# Patient Record
Sex: Female | Born: 1935 | Race: White | Hispanic: No | Smoking: Never smoker
Health system: Southern US, Community
[De-identification: ages and names within clinical notes are randomized; demographics above are authoritative.]

## PROBLEM LIST (undated history)

## (undated) DIAGNOSIS — I10 Essential (primary) hypertension: Secondary | ICD-10-CM

## (undated) DIAGNOSIS — E079 Disorder of thyroid, unspecified: Secondary | ICD-10-CM

---

## 2015-04-11 ENCOUNTER — Emergency Department (HOSPITAL_COMMUNITY)
Admission: EM | Admit: 2015-04-11 | Discharge: 2015-04-11 | Disposition: A | Discharge status: Home / Self Care | Payer: Medicare Other | Attending: Emergency Medicine | Admitting: Emergency Medicine

## 2015-04-11 ENCOUNTER — Emergency Department (HOSPITAL_COMMUNITY): Payer: Medicare Other

## 2015-04-11 ENCOUNTER — Encounter (HOSPITAL_COMMUNITY): Payer: Self-pay | Admitting: Emergency Medicine

## 2015-04-11 DIAGNOSIS — Z79899 Other long term (current) drug therapy: Secondary | ICD-10-CM | POA: Insufficient documentation

## 2015-04-11 DIAGNOSIS — I10 Essential (primary) hypertension: Secondary | ICD-10-CM | POA: Diagnosis not present

## 2015-04-11 DIAGNOSIS — S0003XA Contusion of scalp, initial encounter: Secondary | ICD-10-CM | POA: Diagnosis not present

## 2015-04-11 DIAGNOSIS — W108XXA Fall (on) (from) other stairs and steps, initial encounter: Secondary | ICD-10-CM | POA: Insufficient documentation

## 2015-04-11 DIAGNOSIS — Y998 Other external cause status: Secondary | ICD-10-CM | POA: Insufficient documentation

## 2015-04-11 DIAGNOSIS — S8011XA Contusion of right lower leg, initial encounter: Secondary | ICD-10-CM | POA: Diagnosis not present

## 2015-04-11 DIAGNOSIS — Y9301 Activity, walking, marching and hiking: Secondary | ICD-10-CM | POA: Diagnosis not present

## 2015-04-11 DIAGNOSIS — S0990XA Unspecified injury of head, initial encounter: Secondary | ICD-10-CM | POA: Diagnosis present

## 2015-04-11 DIAGNOSIS — Y9289 Other specified places as the place of occurrence of the external cause: Secondary | ICD-10-CM | POA: Diagnosis not present

## 2015-04-11 DIAGNOSIS — S300XXA Contusion of lower back and pelvis, initial encounter: Secondary | ICD-10-CM | POA: Insufficient documentation

## 2015-04-11 DIAGNOSIS — T148XXA Other injury of unspecified body region, initial encounter: Secondary | ICD-10-CM

## 2015-04-11 DIAGNOSIS — E079 Disorder of thyroid, unspecified: Secondary | ICD-10-CM | POA: Insufficient documentation

## 2015-04-11 HISTORY — DX: Essential (primary) hypertension: I10

## 2015-04-11 HISTORY — DX: Disorder of thyroid, unspecified: E07.9

## 2015-04-11 MED ORDER — IBUPROFEN 200 MG PO TABS
600.0000 mg | ORAL_TABLET | ORAL | Status: AC
  Administered 2015-04-11: 600 mg via ORAL
  Filled 2015-04-11: qty 3

## 2015-04-11 NOTE — ED Notes (Signed)
Pts daughter states she called ahead and was told it was "only a 30 min wait." I explained to Pts daughter that i was sorry she was told that and that wait times are always changing and that i was sorry she was having to wait. Also informed Pts daughter that RN was working with another Pt and that she was trying to make her way in the room as soon as possible.

## 2015-04-11 NOTE — ED Provider Notes (Signed)
CSN: [id number]     Arrival date & time 04/11/15  0725 History   First MD Initiated Contact with Patient 04/11/15 0757     Chief Complaint  Patient presents with  . Head Injury     (Consider location/radiation/quality/duration/timing/severity/associated sxs/prior Treatment) HPI Comments: 81 year old female with history of hypertension and thyroid disease who presents with fall. Just prior to arrival, the patient was walking down stairs and fell down the stairs. She did not lose consciousness. She struck the left side of her head and left hip. She has been able to ambulate but complains of left knee pain, right shin pain, and left head pain since the fall. She denies any neck or back pain. No chest wall pain, abdominal pain, or difficulty breathing. No visual changes. No anticoagulant use. She is up-to-date on tetanus vaccination.  Patient is a 81 y.o. female presenting with head injury. The history is provided by the patient.  Head Injury   Past Medical History  Diagnosis Date  . Hypertension   . Thyroid disease    History reviewed. No pertinent past surgical history. History reviewed. No pertinent family history. Social History  Substance Use Topics  . Smoking status: Never Smoker   . Smokeless tobacco: None  . Alcohol Use: Yes     Comment: occ   OB History    No data available     Review of Systems 10 Systems reviewed and are negative for acute change except as noted in the HPI.    Allergies  Eggs or egg-derived products; Lotemax; Pineapple; and Shrimp  Home Medications   Prior to Admission medications   Medication Sig Start Date End Date Taking? Authorizing Provider  hydrochlorothiazide (HYDRODIURIL) 25 MG tablet Take 25 mg by mouth daily.   Yes Historical Provider, MD  levothyroxine (SYNTHROID, LEVOTHROID) 75 MCG tablet Take 75 mcg by mouth daily before breakfast.   Yes Historical Provider, MD  potassium chloride (K-DUR,KLOR-CON) 10 MEQ tablet Take 10 mEq by mouth  every morning.    Yes Historical Provider, MD   BP 180/70 mmHg  Pulse 88  Temp(Src) 97.9 F (36.6 C) (Oral)  Resp 16  Ht 5' (1.524 m)  Wt 128 lb (58.06 kg)  BMI 25.00 kg/m2  SpO2 100% Physical Exam  Constitutional: She is oriented to person, place, and time. She appears well-developed and well-nourished. No distress.  HENT:  Head: Normocephalic and atraumatic.  Mouth/Throat: Oropharynx is clear and moist.  Hematoma w/ overlying abrasion L lateral scalp, Moist mucous membranes  Eyes: Conjunctivae and EOM are normal. Pupils are equal, round, and reactive to light.  Neck: Neck supple.  Cardiovascular: Normal rate, regular rhythm and normal heart sounds.   No murmur heard. Pulmonary/Chest: Effort normal and breath sounds normal. She exhibits no tenderness.  Abdominal: Soft. Bowel sounds are normal. She exhibits no distension. There is no tenderness.  Musculoskeletal: She exhibits no edema.  L hip tenderness over ecchymosis on iliac crest; normal ROM hips, knees; 2+ pedal pulses; no spinal tenderness or stepoff  Neurological: She is alert and oriented to person, place, and time. A cranial nerve deficit is present. She exhibits normal muscle tone.  Fluent speech  Skin: Skin is warm and dry.  Hematoma L lateral scalp, hematoma R shin, ecchymosis L iliac crest  Psychiatric: She has a normal mood and affect. Judgment normal.  Nursing note and vitals reviewed.   ED Course  Procedures (including critical care time) Labs Review Labs Reviewed - No data to display  Imaging  Review Dg Tibia/fibula Right  04/11/2015  CLINICAL DATA:  Larey Seat down 5-6 steps with left hip, knee and right lower leg pain. EXAM: RIGHT TIBIA AND FIBULA - 2 VIEW COMPARISON:  None. FINDINGS: Mild focal cortical irregularity/lucency over the the fibular head on the lateral film as cannot exclude a nondisplaced fracture. Remainder of the exam is within normal. IMPRESSION: Possible nondisplaced fracture of the fibular head.  Electronically Signed   By: Elberta Fortis M.D.   On: 04/11/2015 09:15   Ct Head Wo Contrast  04/11/2015  CLINICAL DATA:  Fall down stairs with left head injury. EXAM: CT HEAD WITHOUT CONTRAST CT CERVICAL SPINE WITHOUT CONTRAST TECHNIQUE: Multidetector CT imaging of the head and cervical spine was performed following the standard protocol without intravenous contrast. Multiplanar CT image reconstructions of the cervical spine were also generated. COMPARISON:  None. FINDINGS: CT HEAD FINDINGS Ventricles, cisterns and other CSF spaces are normal. There is no mass, mass effect, shift of midline structures or acute hemorrhage. There is no evidence of acute infarction. Subtle chronic ischemic microvascular disease. Moderate size left parietal scalp contusion. No evidence of fracture CT CERVICAL SPINE FINDINGS Vertebral body alignment and heights are within normal. There is mild spondylosis throughout the cervical spine. There is partial fusion of the C2 and 3 vertebral bodies as well as posterior elements. Minimal disc space narrowing at the C6-7 level. Prevertebral soft tissues are within normal. Atlantoaxial articulation is within normal. There is mild uncovertebral joint spurring and facet arthropathy. There is no acute fracture or subluxation. Bilateral neural foraminal narrowing is present at several levels of the lower cervical spine. Few small hypodensities over the thyroid with the largest measuring 9 mm over the left lobe. IMPRESSION: No acute intracranial findings. Moderate size left parietal scalp contusion. No fracture. Minimal chronic ischemic microvascular disease. No acute cervical spine injury. Mild spondylosis throughout the cervical spine with minimal disc disease at the C6-7 level. Neural foraminal narrowing at several levels of the lower cervical spine. Few sub cm hypodense thyroid nodules. Electronically Signed   By: Elberta Fortis M.D.   On: 04/11/2015 09:01   Ct Cervical Spine Wo  Contrast  04/11/2015  CLINICAL DATA:  Fall down stairs with left head injury. EXAM: CT HEAD WITHOUT CONTRAST CT CERVICAL SPINE WITHOUT CONTRAST TECHNIQUE: Multidetector CT imaging of the head and cervical spine was performed following the standard protocol without intravenous contrast. Multiplanar CT image reconstructions of the cervical spine were also generated. COMPARISON:  None. FINDINGS: CT HEAD FINDINGS Ventricles, cisterns and other CSF spaces are normal. There is no mass, mass effect, shift of midline structures or acute hemorrhage. There is no evidence of acute infarction. Subtle chronic ischemic microvascular disease. Moderate size left parietal scalp contusion. No evidence of fracture CT CERVICAL SPINE FINDINGS Vertebral body alignment and heights are within normal. There is mild spondylosis throughout the cervical spine. There is partial fusion of the C2 and 3 vertebral bodies as well as posterior elements. Minimal disc space narrowing at the C6-7 level. Prevertebral soft tissues are within normal. Atlantoaxial articulation is within normal. There is mild uncovertebral joint spurring and facet arthropathy. There is no acute fracture or subluxation. Bilateral neural foraminal narrowing is present at several levels of the lower cervical spine. Few small hypodensities over the thyroid with the largest measuring 9 mm over the left lobe. IMPRESSION: No acute intracranial findings. Moderate size left parietal scalp contusion. No fracture. Minimal chronic ischemic microvascular disease. No acute cervical spine injury. Mild spondylosis throughout  the cervical spine with minimal disc disease at the C6-7 level. Neural foraminal narrowing at several levels of the lower cervical spine. Few sub cm hypodense thyroid nodules. Electronically Signed   By: Elberta Fortis M.D.   On: 04/11/2015 09:01   Dg Knee Complete 4 Views Left  04/11/2015  CLINICAL DATA:  Left knee pain after fall EXAM: LEFT KNEE - COMPLETE 4+  VIEW COMPARISON:  None. FINDINGS: No fracture, joint effusion, malalignment or suspicious focal osseous lesion. Mild joint space narrowing with tiny marginal osteophyte in the medial compartment. Otherwise preserved left knee joint. IMPRESSION: 1. No fracture, joint effusion or malalignment in the left knee. 2. Mild osteoarthritis in the medial compartment of the left knee joint. Electronically Signed   By: Delbert Phenix M.D.   On: 04/11/2015 09:13   Dg Hip Unilat With Pelvis 2-3 Views Left  04/11/2015  CLINICAL DATA:  Larey Seat down steps and complains of left hip pain. EXAM: DG HIP (WITH OR WITHOUT PELVIS) 2-3V LEFT COMPARISON:  None. FINDINGS: The pelvic bony ring is intact. Symmetric appearance of the sacroiliac joints. Moderate amount of stool throughout the lower abdomen and pelvis. Small phleboliths in the pelvis. Mild osteoarthritic changes along the superior aspect of both hip joints. The left hip is located without a fracture. IMPRESSION: No acute bone abnormality to the pelvis or left hip. Electronically Signed   By: Richarda Overlie M.D.   On: 04/11/2015 09:13      EKG Interpretation None      MDM   Final diagnoses:  Scalp hematoma, initial encounter  Bruise   Patient presents with fall down the stairs during which she struck her head but did not lose consciousness. Patient well-appearing at presentation. Vital signs notable for hypertension at 209/74. Normal strength and sensation throughout, multiple ecchymoses but normal range of motion of joints. Obtained CT of head and C-spine as well as plain films of left hip, left knee, and right tib-fib.  CTs were negative for acute injury. Plain films negative with the exception of small cortical irregularity at the fibular head. On reexamination, the patient is able to bear weight on her right leg with no complaints of pain in that area, therefore I do not feel this represents acute fracture. Patient has normal strength and sensation of her right  foot. Discussed supportive care instructions and instructed to follow-up with PCP if she develops any pain in her proximal right lower leg. Reviewed return precautions regarding head injury. The patient and her daughter voiced understanding and the patient was discharged in satisfactory condition.   Laurence Spates, MD 04/11/15 [phone]

## 2015-04-11 NOTE — ED Notes (Signed)
Patient reports that she fell down the stairs and hit her head to her left Head and has a small hematoma to her left head. Patient also reports that she is having pain to her right shin, she also have pain to her left hip. No shortening or rotation noted. The patient reports that she also has some minor aches and pain.

## 2015-04-11 NOTE — Discharge Instructions (Signed)

## 2016-10-24 IMAGING — CR DG KNEE COMPLETE 4+V*L*
4 series · 4 of 4 positions shown · non-contrast
Comparison: None.

CLINICAL DATA: Left knee pain after fall

EXAM:
LEFT KNEE - COMPLETE 4+ VIEW

[t knee ap left]
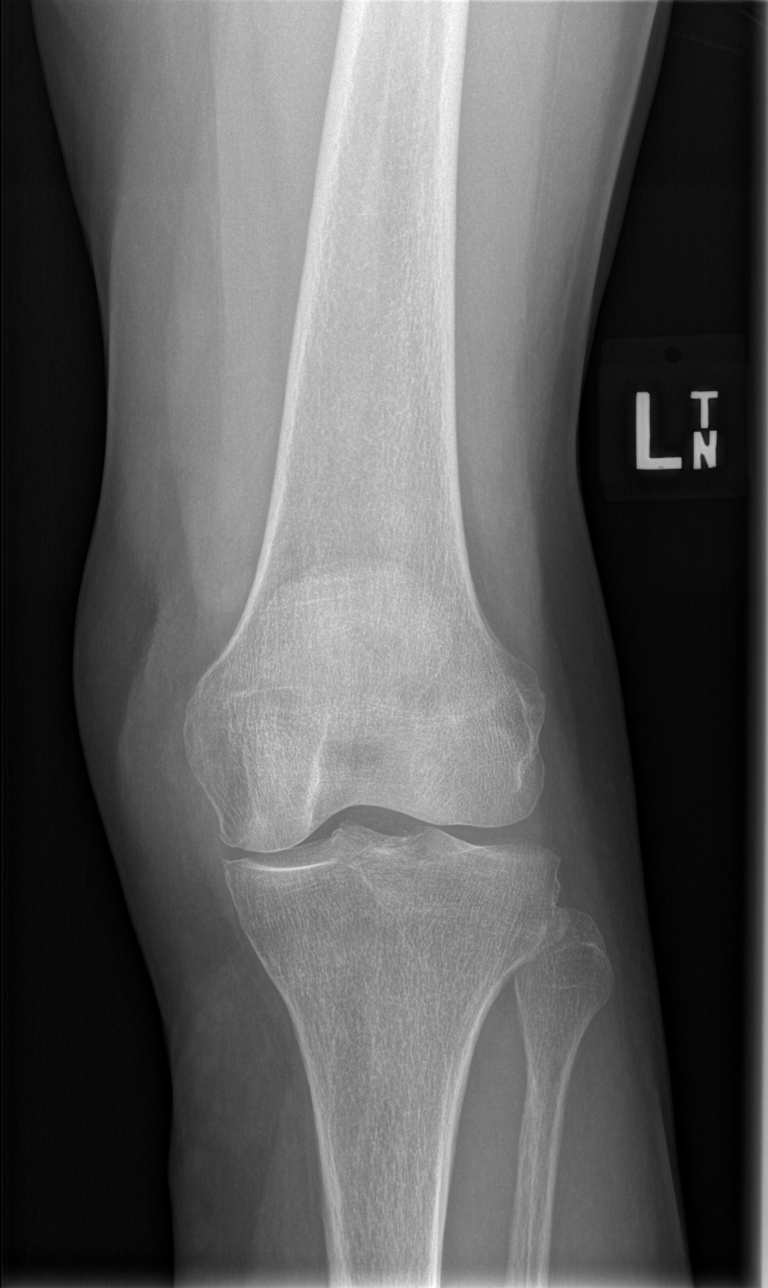

[t knee obl left (1 of 2)]
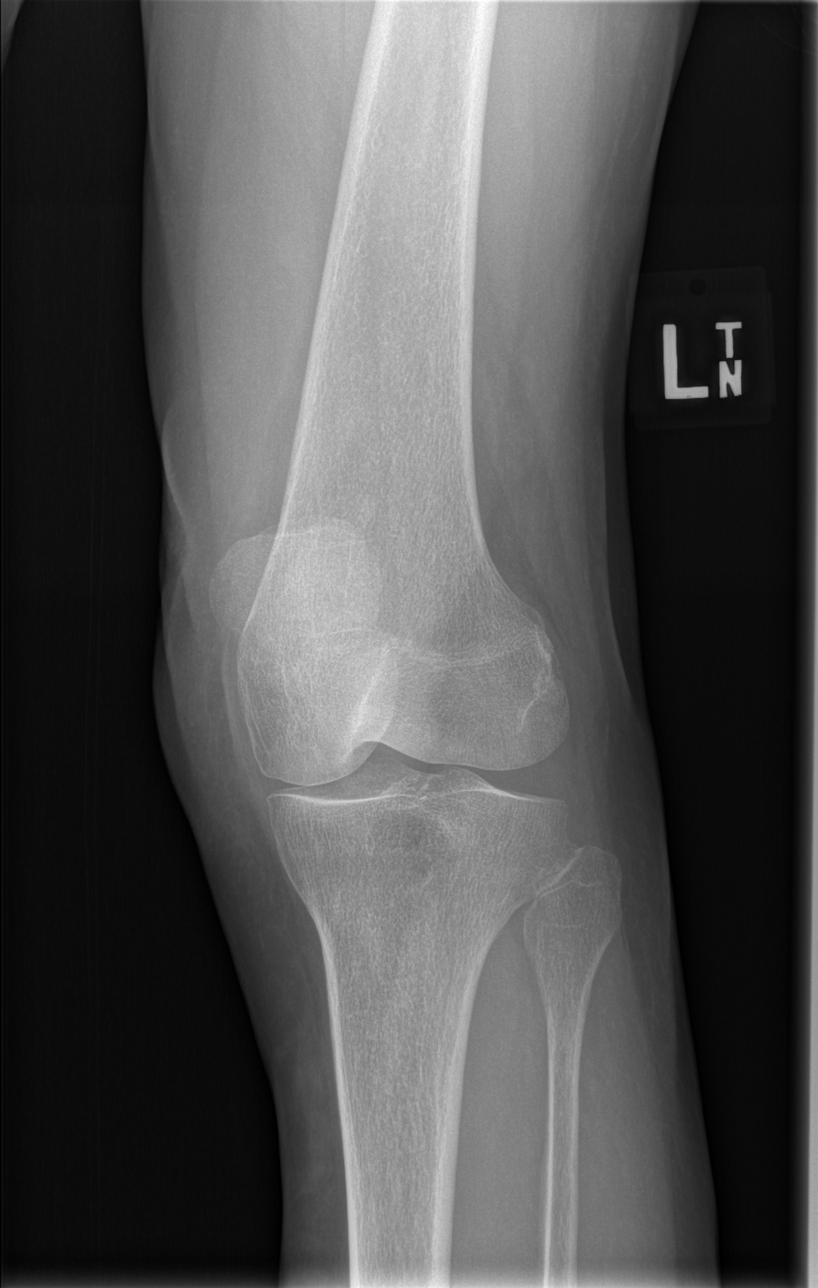

[t knee obl left (2 of 2)]
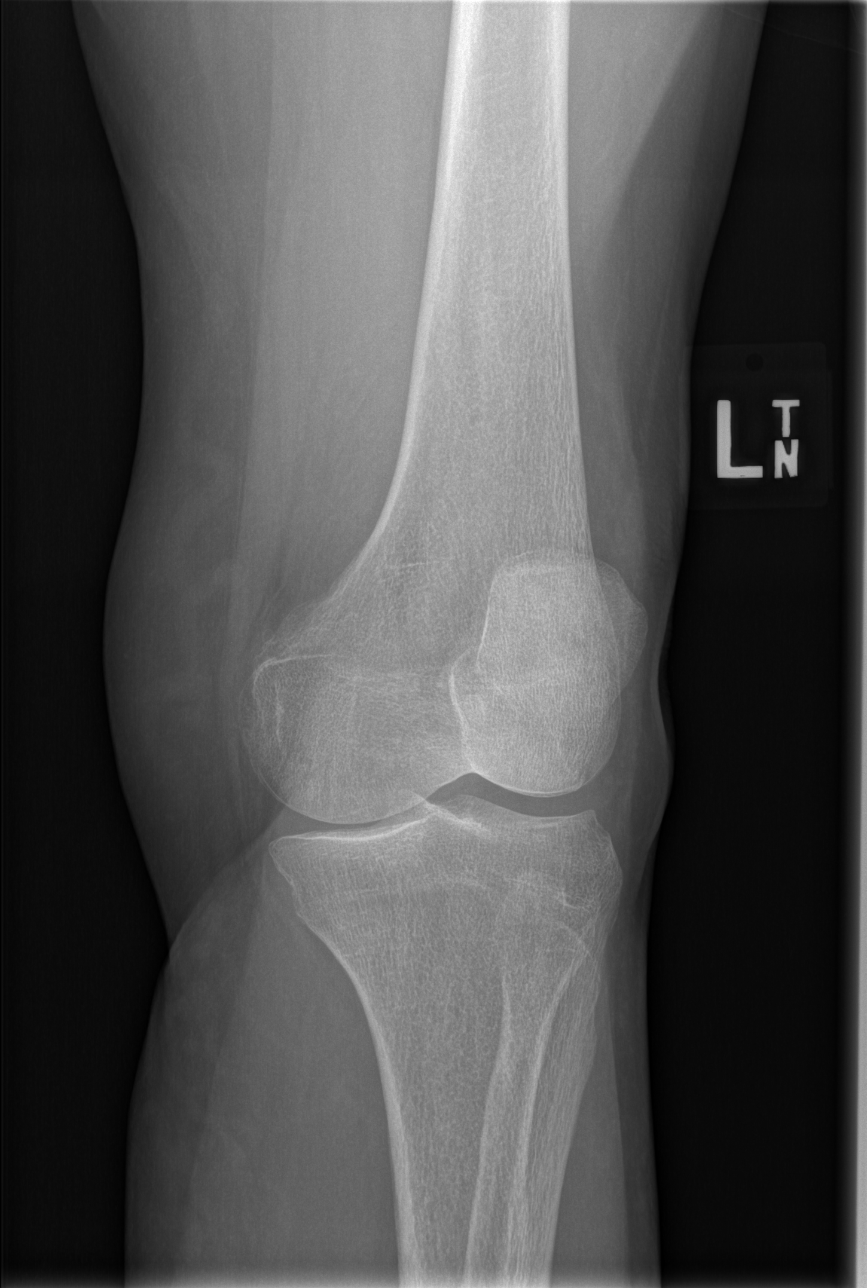

[t knee lat left]
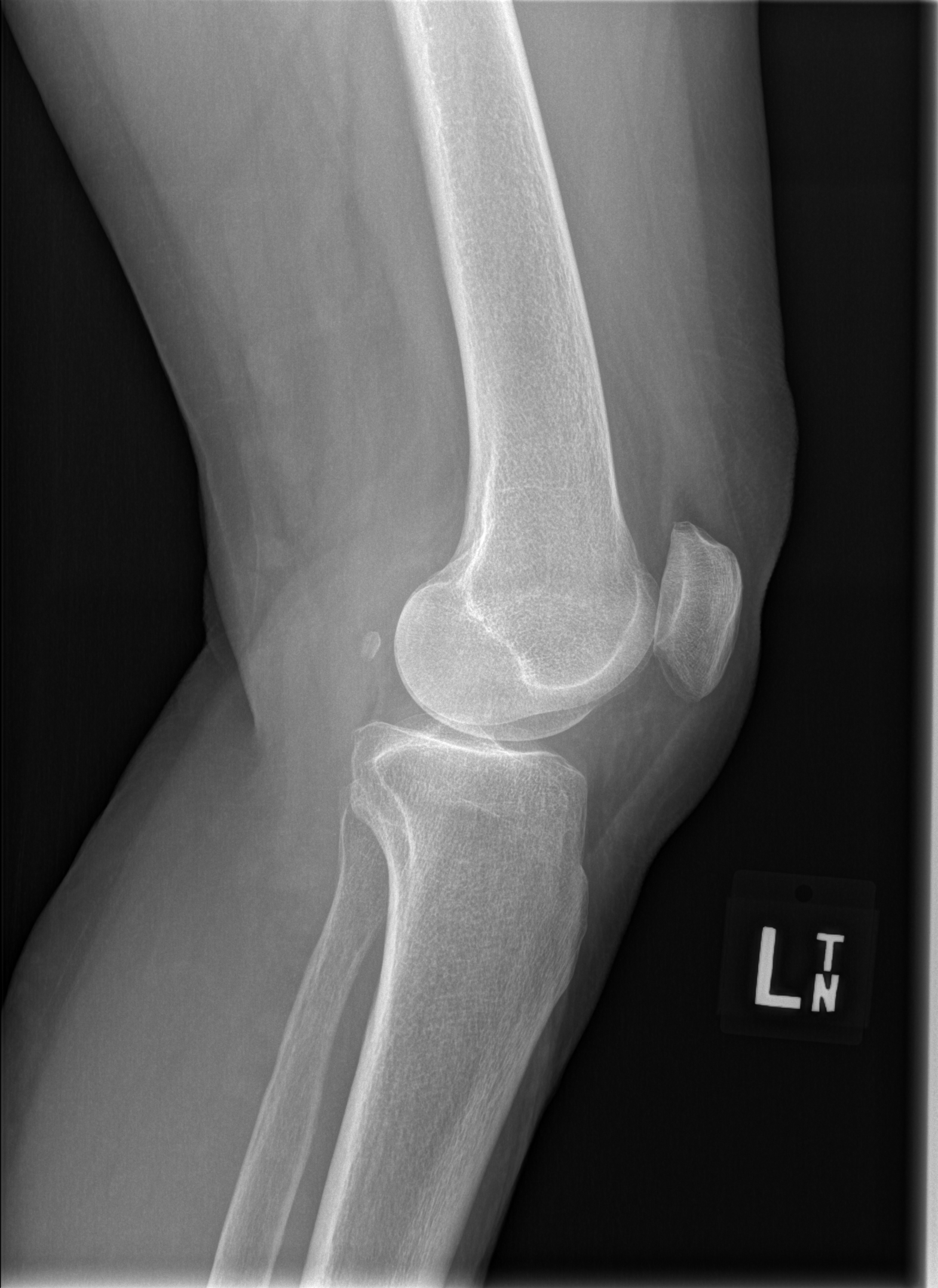

[4 of 4 positions shown; findings below may reference images not displayed]

FINDINGS: No fracture, joint effusion, malalignment or suspicious focal
osseous lesion. Mild joint space narrowing with tiny marginal
osteophyte in the medial compartment. Otherwise preserved left knee
joint.
IMPRESSION: 1. No fracture, joint effusion or malalignment in the left knee.
2. Mild osteoarthritis in the medial compartment of the left knee
joint.

## 2016-10-24 IMAGING — CT CT CERVICAL SPINE W/O CM
3 of 4 series · 13 of 27 positions shown, 16 images · non-contrast
Comparison: None.

CLINICAL DATA: Fall down stairs with left head injury.

EXAM:
CT HEAD WITHOUT CONTRAST
CT CERVICAL SPINE WITHOUT CONTRAST
TECHNIQUE: Multidetector CT imaging of the head and cervical spine was
performed following the standard protocol without intravenous
contrast. Multiplanar CT image reconstructions of the cervical spine
were also generated.

[Series 4: bone windows · axial · 0.43mm/px · z∈[-120,-48]mm · 3 of 49 slices shown]
[im 13/49  bone]
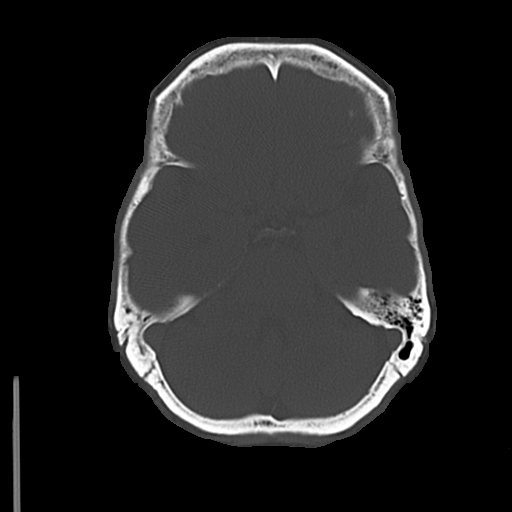
[im 25/49  bone]
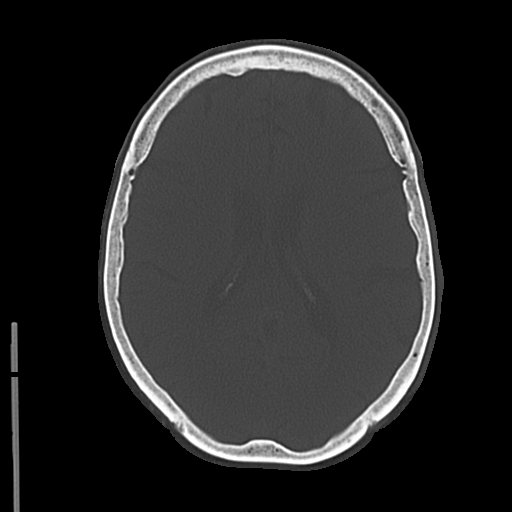
[im 37/49  bone]
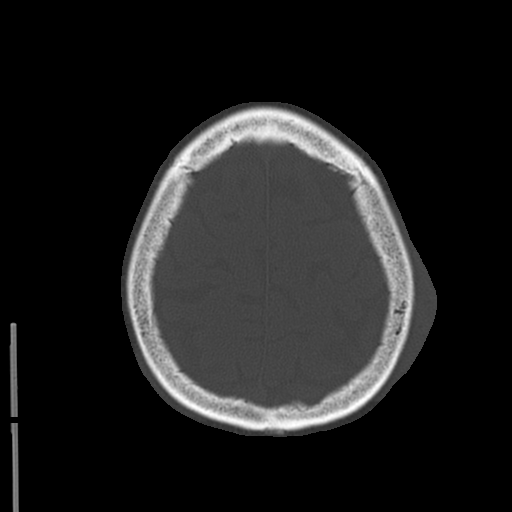

[Series 5: c-spine st · axial · 0.26mm/px · z∈[-276,-180]mm · 5 of 74 slices shown, 7 images]
[im 13/74  soft-tissue]
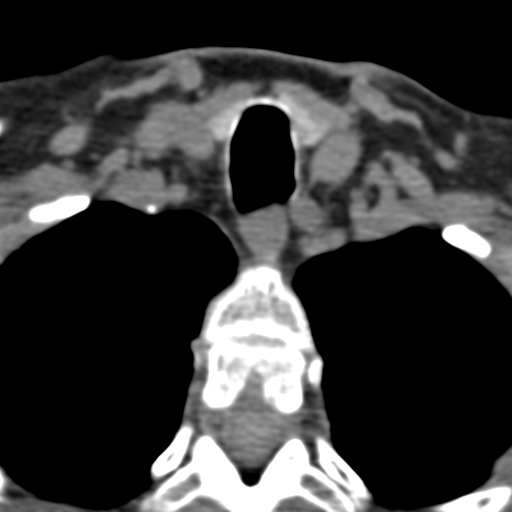
[im 13/74  bone]
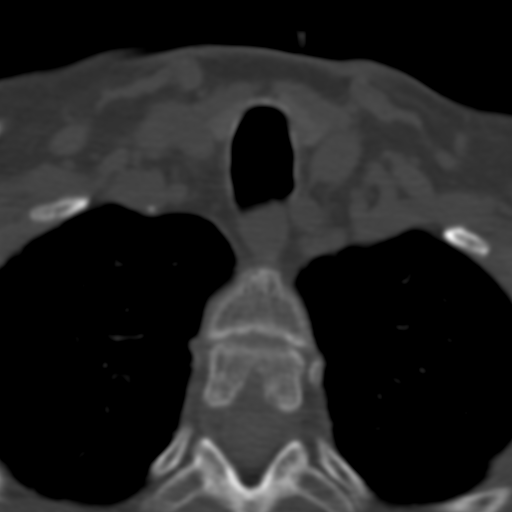
[im 25/74  bone]
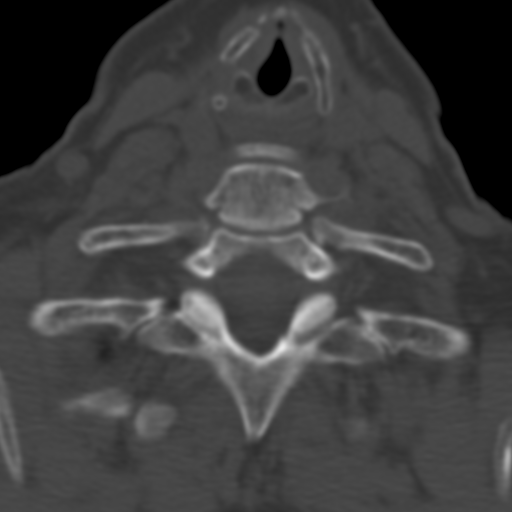
[im 37/74  bone]
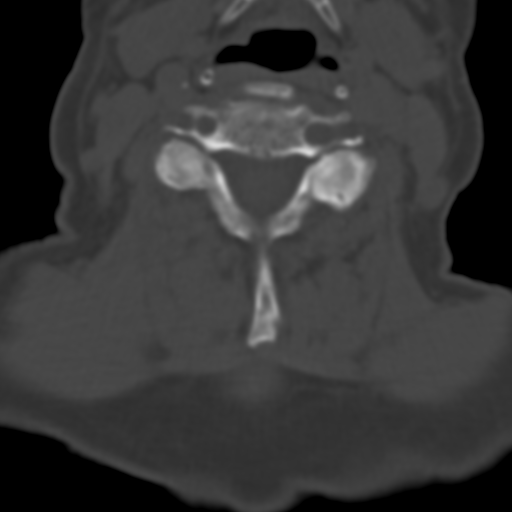
[im 49/74  bone]
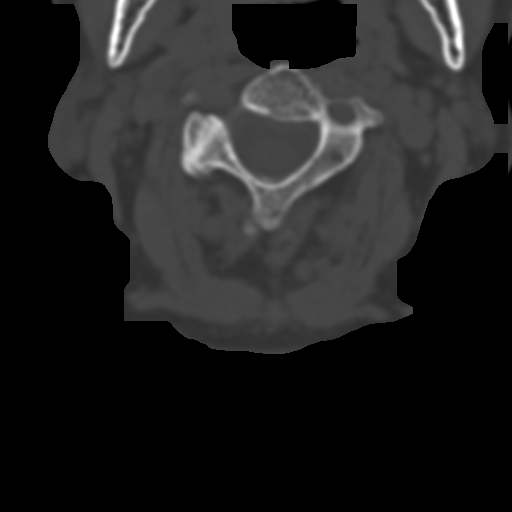
[im 61/74  soft-tissue]
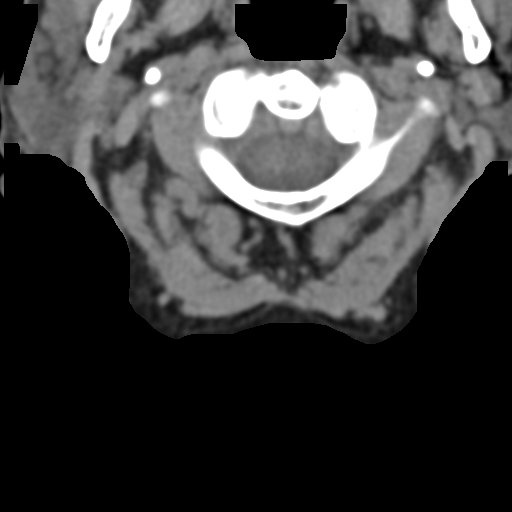
[im 61/74  bone]
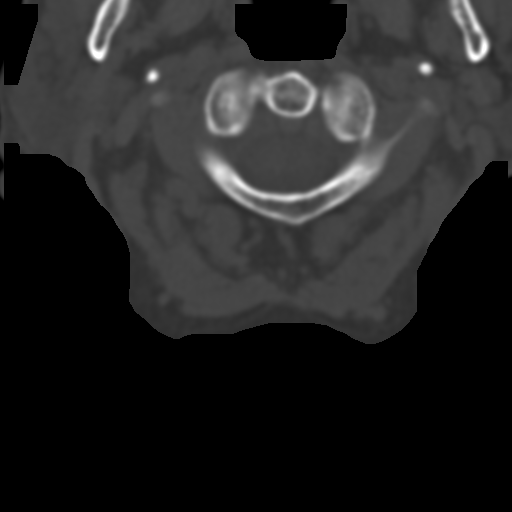

[Series 10: sagittal · sagittal · 0.28mm/px · 5 of 36 slices shown, 6 images]
[im 12/36  bone]
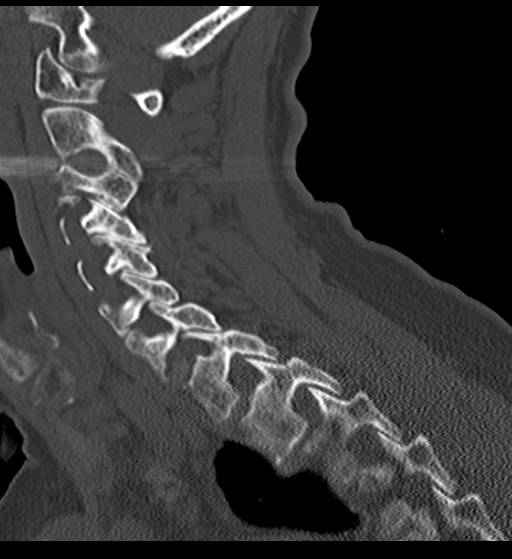
[im 15/36  bone]
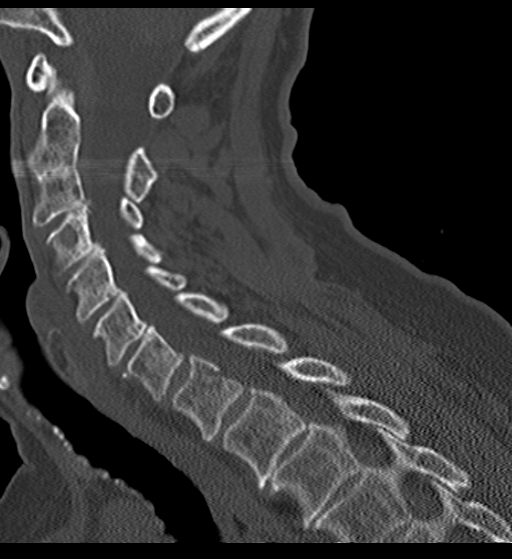
[im 18/36  soft-tissue]
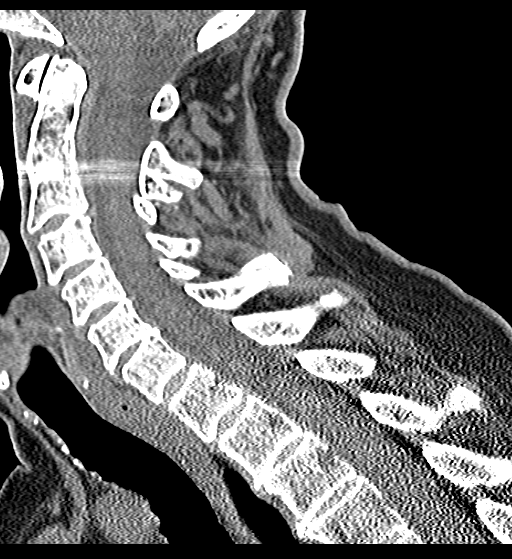
[im 18/36  bone]
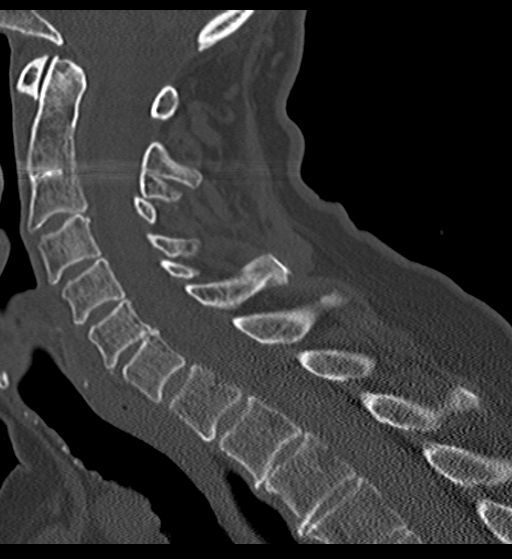
[im 21/36  bone]
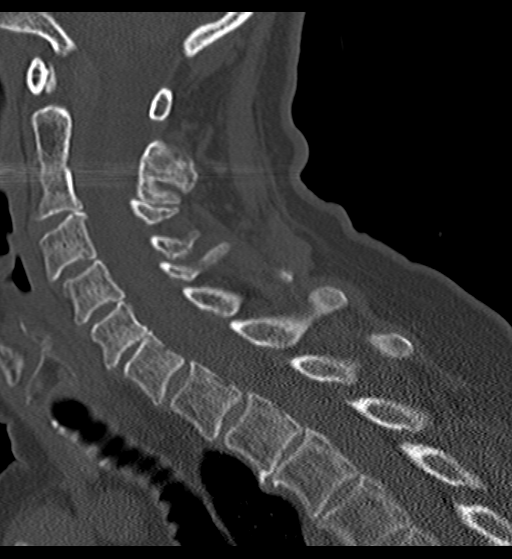
[im 24/36  bone]
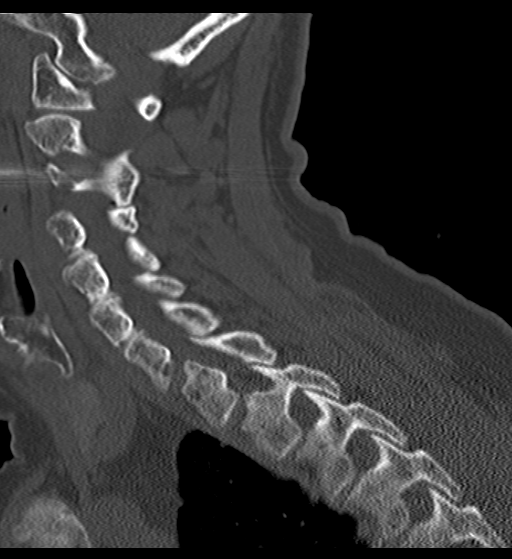

[13 of 27 positions shown; findings below may reference images not displayed]

FINDINGS: CT HEAD FINDINGS

Ventricles, cisterns and other CSF spaces are normal. There is no
mass, mass effect, shift of midline structures or acute hemorrhage.
There is no evidence of acute infarction. Subtle chronic ischemic
microvascular disease. Moderate size left parietal scalp contusion.
No evidence of fracture

CT CERVICAL SPINE FINDINGS

Vertebral body alignment and heights are within normal. There is
mild spondylosis throughout the cervical spine. There is partial
fusion of the C2 and 3 vertebral bodies as well as posterior
elements. Minimal disc space narrowing at the C6-7 level.
Prevertebral soft tissues are within normal. Atlantoaxial
articulation is within normal. There is mild uncovertebral joint
spurring and facet arthropathy. There is no acute fracture or
subluxation. Bilateral neural foraminal narrowing is present at
several levels of the lower cervical spine.

Few small hypodensities over the thyroid with the largest measuring
9 mm over the left lobe.
IMPRESSION: No acute intracranial findings. Moderate size left parietal scalp
contusion. No fracture.

Minimal chronic ischemic microvascular disease.

No acute cervical spine injury.

Mild spondylosis throughout the cervical spine with minimal disc
disease at the C6-7 level. Neural foraminal narrowing at several
levels of the lower cervical spine.

Few sub cm hypodense thyroid nodules.
